# Patient Record
Sex: Female | Born: 1995 | Race: White | Hispanic: No | Marital: Single | Smoking: Current every day smoker
Health system: Southern US, Community
[De-identification: ages and names within clinical notes are randomized; demographics above are authoritative.]

## PROBLEM LIST (undated history)

## (undated) VITALS — BP 116/79 | HR 85 | Temp 97.6°F | Resp 16 | Ht 64.96 in | Wt 187.4 lb

## (undated) DIAGNOSIS — G43909 Migraine, unspecified, not intractable, without status migrainosus: Secondary | ICD-10-CM

## (undated) DIAGNOSIS — F419 Anxiety disorder, unspecified: Secondary | ICD-10-CM

## (undated) DIAGNOSIS — E669 Obesity, unspecified: Secondary | ICD-10-CM

## (undated) HISTORY — PX: NO PAST SURGERIES: SHX2092

## (undated) HISTORY — PX: TONSILLECTOMY: SUR1361

---
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
## 2015-05-28 ENCOUNTER — Encounter: Payer: Self-pay | Admitting: Emergency Medicine

## 2015-05-28 ENCOUNTER — Emergency Department
Admission: EM | Admit: 2015-05-28 | Discharge: 2015-05-28 | Disposition: A | Discharge status: Home / Self Care | Payer: Self-pay | Attending: Emergency Medicine | Admitting: Emergency Medicine

## 2015-05-28 ENCOUNTER — Emergency Department: Payer: Self-pay

## 2015-05-28 DIAGNOSIS — R102 Pelvic and perineal pain: Secondary | ICD-10-CM

## 2015-05-28 DIAGNOSIS — F17210 Nicotine dependence, cigarettes, uncomplicated: Secondary | ICD-10-CM | POA: Insufficient documentation

## 2015-05-28 DIAGNOSIS — Z3202 Encounter for pregnancy test, result negative: Secondary | ICD-10-CM | POA: Insufficient documentation

## 2015-05-28 DIAGNOSIS — Z79899 Other long term (current) drug therapy: Secondary | ICD-10-CM | POA: Insufficient documentation

## 2015-05-28 DIAGNOSIS — N83202 Unspecified ovarian cyst, left side: Secondary | ICD-10-CM | POA: Insufficient documentation

## 2015-05-28 DIAGNOSIS — B9689 Other specified bacterial agents as the cause of diseases classified elsewhere: Secondary | ICD-10-CM

## 2015-05-28 DIAGNOSIS — N760 Acute vaginitis: Secondary | ICD-10-CM | POA: Insufficient documentation

## 2015-05-28 DIAGNOSIS — Z792 Long term (current) use of antibiotics: Secondary | ICD-10-CM | POA: Insufficient documentation

## 2015-05-28 LAB — COMPREHENSIVE METABOLIC PANEL
ALBUMIN: 4.5 g/dL (ref 3.5–5.0)
ALT: 15 U/L (ref 14–54)
AST: 19 U/L (ref 15–41)
Alkaline Phosphatase: 58 U/L (ref 38–126)
Anion gap: 8 (ref 5–15)
BUN: 12 mg/dL (ref 6–20)
CHLORIDE: 107 mmol/L (ref 101–111)
CO2: 22 mmol/L (ref 22–32)
Calcium: 9.1 mg/dL (ref 8.9–10.3)
Creatinine, Ser: 0.77 mg/dL (ref 0.44–1.00)
GFR calc non Af Amer: >60 mL/min (ref >60)
GLUCOSE: 102 mg/dL — AB (ref 65–99)
Potassium: 3.5 mmol/L (ref 3.5–5.1)
SODIUM: 137 mmol/L (ref 135–145)
Total Bilirubin: 0.9 mg/dL (ref 0.3–1.2)
Total Protein: 7.6 g/dL (ref 6.5–8.1)

## 2015-05-28 LAB — URINALYSIS COMPLETE WITH MICROSCOPIC (ARMC ONLY)
Bilirubin Urine: NEGATIVE
Glucose, UA: NEGATIVE mg/dL
Nitrite: NEGATIVE
Specific Gravity, Urine: 1.031 — ABNORMAL HIGH (ref 1.005–1.030)
pH: 5 (ref 5.0–8.0)

## 2015-05-28 LAB — CBC
HCT: 38.7 % (ref 35.0–47.0)
HEMOGLOBIN: 13.4 g/dL (ref 12.0–16.0)
MCH: 31.8 pg (ref 26.0–34.0)
MCHC: 34.7 g/dL (ref 32.0–36.0)
MCV: 91.5 fL (ref 80.0–100.0)
Platelets: 283 10*3/uL (ref 150–440)
RBC: 4.23 MIL/uL (ref 3.80–5.20)
RDW: 12.5 % (ref 11.5–14.5)
WBC: 7 10*3/uL (ref 3.6–11.0)

## 2015-05-28 LAB — WET PREP, GENITAL
Clue Cells Wet Prep HPF POC: POSITIVE — AB
SPERM: NONE SEEN
Trich, Wet Prep: NONE SEEN
Yeast Wet Prep HPF POC: NONE SEEN

## 2015-05-28 LAB — CHLAMYDIA/NGC RT PCR (ARMC ONLY)
Chlamydia Tr: NOT DETECTED
N gonorrhoeae: NOT DETECTED

## 2015-05-28 LAB — LIPASE, BLOOD: LIPASE: 20 U/L (ref 11–51)

## 2015-05-28 LAB — POCT PREGNANCY, URINE: PREG TEST UR: NEGATIVE

## 2015-05-28 MED ORDER — METRONIDAZOLE 500 MG PO TABS
500.0000 mg | ORAL_TABLET | Freq: Once | ORAL | Status: AC
  Administered 2015-05-28: 500 mg via ORAL
  Filled 2015-05-28: qty 1

## 2015-05-28 MED ORDER — METRONIDAZOLE 500 MG PO TABS: 500.0000 mg | ORAL_TABLET | Freq: Two times a day (BID) | ORAL | Status: AC

## 2015-05-28 NOTE — Discharge Instructions (Signed)

## 2015-05-28 NOTE — ED Provider Notes (Signed)
[facility] Emergency Department Provider Note  ____________________________________________  Time seen: Approximately 205PM  I have reviewed the triage vital signs and the nursing notes.   HISTORY  Chief Complaint Abdominal Pain    HPI Hayley Schneider is a 20 y.o. female with a history of an IUD 4 years who is presenting today with sudden onset sharp pelvic pain that began at 73 AM. She says the pain has since dissipated. She denies any vaginal discharge but does say that she has some spotting. She denies any nausea vomiting or diarrhea. She denies any urinary symptoms. She says at this time the pain has abated.   Past Medical History  Diagnosis Date  . Anxiety   . Obesity     Patient Active Problem List   Diagnosis Date Noted  . Cannabis abuse 05/03/2012  . MDD (major depressive disorder), recurrent episode, severe (HCC) 08/12/2011  . Oppositional defiant disorder 08/12/2011  . GAD (generalized anxiety disorder) 08/12/2011    Past Surgical History  Procedure Laterality Date  . No past surgeries    . Tonsillectomy      Current Outpatient Rx  Name  Route  Sig  Dispense  Refill  . clindamycin (CLEOCIN) 300 MG capsule   Oral   Take 1 capsule (300 mg total) by mouth 3 (three) times daily.   30 capsule   0   . HYDROcodone-acetaminophen (NORCO/VICODIN) 5-325 MG per tablet   Oral   Take 1 tablet by mouth every 6 (six) hours as needed for moderate pain.   15 tablet   0   . ibuprofen (ADVIL,MOTRIN) 800 MG tablet   Oral   Take 1 tablet (800 mg total) by mouth every 6 (six) hours as needed (headache).   30 tablet   1   . levonorgestrel (MIRENA) 20 MCG/24HR IUD   Intrauterine   1 each by Intrauterine route once.           Allergies Review of patient's allergies indicates no known allergies.  Family History  Problem Relation Age of Onset  . Bipolar disorder Mother     Social History Social History  Substance Use Topics  .  Smoking status: Current Every Day Smoker -- 0.50 packs/day for 3 years    Types: Cigarettes  . Smokeless tobacco: Never Used  . Alcohol Use: Yes     Comment: occasional    Review of Systems Constitutional: No fever/chills Eyes: No visual changes. ENT: No sore throat. Cardiovascular: Denies chest pain. Respiratory: Denies shortness of breath. Gastrointestinal:   No nausea, no vomiting.  No diarrhea.  No constipation. Genitourinary: Negative for dysuria. Musculoskeletal: Negative for back pain. Skin: Negative for rash. Neurological: Negative for headaches, focal weakness or numbness.  10-point ROS otherwise negative.  ____________________________________________   PHYSICAL EXAM:  VITAL SIGNS: ED Triage Vitals  Enc Vitals Group     BP 05/28/15 1315 132/95 mmHg     Pulse Rate 05/28/15 1315 87     Resp 05/28/15 1315 20     Temp 05/28/15 1315 98.6 F (37 C)     Temp Source 05/28/15 1315 Oral     SpO2 05/28/15 1315 100 %     Weight 05/28/15 1315 160 lb (72.576 kg)     Height 05/28/15 1315  (1.651 m)     Head Cir --      Peak Flow --      Pain Score 05/28/15 1315 8     Pain Loc --  Pain Edu? --      Excl. in GC? --     Constitutional: Alert and oriented. Well appearing and in no acute distress. Eyes: Conjunctivae are normal. PERRL. EOMI. Head: Atraumatic. Nose: No congestion/rhinnorhea. Mouth/Throat: Mucous membranes are moist.   Neck: No stridor.   Cardiovascular: Normal rate, regular rhythm. Grossly normal heart sounds.  Respiratory: Normal respiratory effort.  No retractions. Lungs CTAB. Gastrointestinal: Sowith mild suprapubic tenderness to palpation.  No distention. No abdominal bruits. No CVA tenderness. Genitourinary:  Normal external appearance. Speculum exam with a yellow to green following discharge. The strings from the IUD are found protruding from the cervix. Bimanual exam without any cervical motion tenderness. There is no uterine or adnexal  tenderness nor  are there any masses palpated.  Musculoskeletal: No lower extremity tenderness nor edema.  No joint effusions. Neurologic:  Normal speech and language. No gross focal neurologic deficits are appreciated. No gait instability. Skin:  Skin is warm, dry and intact. No rash noted. Psychiatric: Mood and affect are normal. Speech and behavior are normal.  ____________________________________________   LABS (all labs ordered are listed, but only abnormal results are displayed)  Labs Reviewed  WET PREP, GENITAL - Abnormal; Notable for the following:    Clue Cells Wet Prep HPF POC POSITIVE (*)    WBC, Wet Prep HPF POC FEW (*)    All other components within normal limits  COMPREHENSIVE METABOLIC PANEL - Abnormal; Notable for the following:    Glucose, Bld 102 (*)    All other components within normal limits  URINALYSIS COMPLETEWITH MICROSCOPIC ([facility] ONLY) - Abnormal; Notable for the following:    Color, Urine AMBER (*)    APPearance CLOUDY (*)    Ketones, ur 1+ (*)    Specific Gravity, Urine 1.031 (*)    Hgb urine dipstick 3+ (*)    Protein, ur >500 (*)    Leukocytes, UA 2+ (*)    Bacteria, UA MANY (*)    Squamous Epithelial / LPF TOO NUMEROUS TO COUNT (*)    All other components within normal limits  CHLAMYDIA/NGC RT PCR ([facility] ONLY)  URINE CULTURE  LIPASE, BLOOD  CBC  POCT PREGNANCY, URINE  POC URINE PREG, ED   ____________________________________________  EKG   ____________________________________________  RADIOLOGY  IMPRESSION: 1. No specific abnormality is identified. There is a 2 cm suspected corpus luteum cyst in the left ovary. No free pelvic fluid. The IUD appears satisfactorily positioned. ____________________________________________   PROCEDURES    ____________________________________________   INITIAL IMPRESSION / ASSESSMENT AND PLAN / ED COURSE  Pertinent labs & imaging results that were available during my care of the patient  were reviewed by me and considered in my medical decision making (see chart for details).  Concerned because of discharge that her pain may be secondary to an intravaginal infection. Doubt pathology related to the IUD as the strings were found to be in place.   ----------------------------------------- 4:08 PM on 05/28/2015 -----------------------------------------  Patient is resting comfortably in the room without any signs of distress. Updated patient about her lab as well as imaging results. 2 cm suspected course luteum cyst in the left. However, more likely cause of the symptoms is bacterial vaginosis. I will give her follow-up with the on call your hand. Do not suspect the IUD is causing any issue this visit. We'll discharge with Flagyl. Patient knows not to drink with taking antibiotic. She  Is Understanding of the plan and willing to comply. ____________________________________________   FINAL CLINICAL  IMPRESSION(S) / ED DIAGNOSES  Final diagnoses:  Pelvic pain in female  Pelvic pain in female   bacterial vaginosis  Myrna Blazer, MD 05/28/15 [phone]

## 2015-05-28 NOTE — ED Notes (Signed)
AAOx3.  Skin warm and dry.  NAD  Ambulates with easy and steady gait.  Posture upright and relaxed. 

## 2015-05-28 NOTE — ED Notes (Signed)
Pt to ed with c/o abd pain that started this am.  Pt states sharpe lower abd pain.  Reports she has a mirena in place x 4 years and is concerned it may be related to that. Denies vaginal bleeding.

## 2015-05-30 LAB — URINE CULTURE

## 2015-06-29 ENCOUNTER — Encounter: Payer: Self-pay | Admitting: Emergency Medicine

## 2015-06-29 ENCOUNTER — Emergency Department
Admission: EM | Admit: 2015-06-29 | Discharge: 2015-06-29 | Disposition: A | Discharge status: Home / Self Care | Payer: Medicaid Other | Attending: Emergency Medicine | Admitting: Emergency Medicine

## 2015-06-29 DIAGNOSIS — Z792 Long term (current) use of antibiotics: Secondary | ICD-10-CM | POA: Insufficient documentation

## 2015-06-29 DIAGNOSIS — R102 Pelvic and perineal pain: Secondary | ICD-10-CM

## 2015-06-29 DIAGNOSIS — Z3202 Encounter for pregnancy test, result negative: Secondary | ICD-10-CM | POA: Insufficient documentation

## 2015-06-29 DIAGNOSIS — F17210 Nicotine dependence, cigarettes, uncomplicated: Secondary | ICD-10-CM | POA: Insufficient documentation

## 2015-06-29 LAB — URINALYSIS COMPLETE WITH MICROSCOPIC (ARMC ONLY)
Bilirubin Urine: NEGATIVE
Glucose, UA: NEGATIVE mg/dL
KETONES UR: NEGATIVE mg/dL
Leukocytes, UA: NEGATIVE
NITRITE: NEGATIVE
PH: 6 (ref 5.0–8.0)
PROTEIN: NEGATIVE mg/dL
SPECIFIC GRAVITY, URINE: 1.023 (ref 1.005–1.030)

## 2015-06-29 LAB — CBC WITH DIFFERENTIAL/PLATELET
BASOS ABS: 0.1 10*3/uL (ref 0–0.1)
BASOS PCT: 1 %
EOS PCT: 5 %
Eosinophils Absolute: 0.4 10*3/uL (ref 0–0.7)
HCT: 40.2 % (ref 35.0–47.0)
Hemoglobin: 14.1 g/dL (ref 12.0–16.0)
Lymphocytes Relative: 40 %
Lymphs Abs: 3.1 10*3/uL (ref 1.0–3.6)
MCH: 32.5 pg (ref 26.0–34.0)
MCHC: 35.1 g/dL (ref 32.0–36.0)
MCV: 92.6 fL (ref 80.0–100.0)
Monocytes Absolute: 0.6 10*3/uL (ref 0.2–0.9)
Monocytes Relative: 8 %
Neutro Abs: 3.5 10*3/uL (ref 1.4–6.5)
Neutrophils Relative %: 46 %
PLATELETS: 273 10*3/uL (ref 150–440)
RBC: 4.34 MIL/uL (ref 3.80–5.20)
RDW: 12.9 % (ref 11.5–14.5)
WBC: 7.8 10*3/uL (ref 3.6–11.0)

## 2015-06-29 LAB — LIPASE, BLOOD: Lipase: 30 U/L (ref 11–51)

## 2015-06-29 LAB — COMPREHENSIVE METABOLIC PANEL
ALT: 20 U/L (ref 14–54)
AST: 20 U/L (ref 15–41)
Albumin: 4.4 g/dL (ref 3.5–5.0)
Alkaline Phosphatase: 56 U/L (ref 38–126)
Anion gap: 8 (ref 5–15)
BUN: 13 mg/dL (ref 6–20)
CHLORIDE: 107 mmol/L (ref 101–111)
CO2: 24 mmol/L (ref 22–32)
CREATININE: 0.85 mg/dL (ref 0.44–1.00)
Calcium: 9 mg/dL (ref 8.9–10.3)
GFR calc non Af Amer: >60 mL/min (ref >60)
Glucose, Bld: 98 mg/dL (ref 65–99)
Potassium: 3.4 mmol/L — ABNORMAL LOW (ref 3.5–5.1)
Sodium: 139 mmol/L (ref 135–145)
Total Bilirubin: 0.5 mg/dL (ref 0.3–1.2)
Total Protein: 7.5 g/dL (ref 6.5–8.1)

## 2015-06-29 LAB — POCT PREGNANCY, URINE: Preg Test, Ur: NEGATIVE

## 2015-06-29 MED ORDER — ONDANSETRON HCL 4 MG/2ML IJ SOLN
4.0000 mg | Freq: Once | INTRAMUSCULAR | Status: AC
  Administered 2015-06-29: 4 mg via INTRAVENOUS

## 2015-06-29 MED ORDER — MORPHINE SULFATE (PF) 4 MG/ML IV SOLN
4.0000 mg | Freq: Once | INTRAVENOUS | Status: AC
  Administered 2015-06-29: 4 mg via INTRAVENOUS

## 2015-06-29 MED ORDER — MORPHINE SULFATE (PF) 4 MG/ML IV SOLN
INTRAVENOUS | Status: AC
  Administered 2015-06-29: 4 mg via INTRAVENOUS
  Filled 2015-06-29: qty 1

## 2015-06-29 MED ORDER — TRAMADOL HCL 50 MG PO TABS: 50.0000 mg | ORAL_TABLET | Freq: Four times a day (QID) | ORAL | Status: AC | PRN

## 2015-06-29 MED ORDER — ONDANSETRON HCL 4 MG/2ML IJ SOLN
INTRAMUSCULAR | Status: AC
  Administered 2015-06-29: 4 mg via INTRAVENOUS
  Filled 2015-06-29: qty 2

## 2015-06-29 NOTE — ED Provider Notes (Signed)
[facility] Emergency Department Provider Note    ____________________________________________  Time seen: ~0510  I have reviewed the triage vital signs and the nursing notes.   HISTORY  Chief Complaint Abdominal pain  History limited by: Not Limited   HPI Hayley Schneider is a 20 y.o. female who presents to the emergency department today because of concerns for abdominal pain. She states that the pain started today. She did take some pain medication. Became severe tonight. She tried to sleep but was unable to. She describes the pain as being located in the suprapubic region. It is typically cramping but then will become sharp. She did have a bowel movement which did not change the pain. She states she was seen in the emergency department a couple of weeks ago and diagnosed with ovarian cyst however states today's pain is worse. No recent fevers. No nausea or vomiting.   Past Medical History  Diagnosis Date  . Anxiety   . Obesity     Patient Active Problem List   Diagnosis Date Noted  . Cannabis abuse 05/03/2012  . MDD (major depressive disorder), recurrent episode, severe (HCC) 08/12/2011  . Oppositional defiant disorder 08/12/2011  . GAD (generalized anxiety disorder) 08/12/2011    Past Surgical History  Procedure Laterality Date  . No past surgeries    . Tonsillectomy      Current Outpatient Rx  Name  Route  Sig  Dispense  Refill  . clindamycin (CLEOCIN) 300 MG capsule   Oral   Take 1 capsule (300 mg total) by mouth 3 (three) times daily.   30 capsule   0   . HYDROcodone-acetaminophen (NORCO/VICODIN) 5-325 MG per tablet   Oral   Take 1 tablet by mouth every 6 (six) hours as needed for moderate pain.   15 tablet   0   . ibuprofen (ADVIL,MOTRIN) 800 MG tablet   Oral   Take 1 tablet (800 mg total) by mouth every 6 (six) hours as needed (headache).   30 tablet   1   . levonorgestrel (MIRENA) 20 MCG/24HR IUD   Intrauterine   1  each by Intrauterine route once.           Allergies Review of patient's allergies indicates no known allergies.  Family History  Problem Relation Age of Onset  . Bipolar disorder Mother     Social History Social History  Substance Use Topics  . Smoking status: Current Every Day Smoker -- 0.50 packs/day for 3 years    Types: Cigarettes  . Smokeless tobacco: Never Used  . Alcohol Use: Yes     Comment: occasional    Review of Systems  Constitutional: Negative for fever. Cardiovascular: Negative for chest pain. Respiratory: Negative for shortness of breath. Gastrointestinal: Negative for abdominal pain, vomiting and diarrhea. Neurological: Negative for headaches, focal weakness or numbness.  10-point ROS otherwise negative.  ____________________________________________   PHYSICAL EXAM:  VITAL SIGNS:    97.8 F (36.6 C)  98   24  138/80 mmHg  99 %    Constitutional: Alert and oriented. Appears uncomfortable. Eyes: Conjunctivae are normal. PERRL. Normal extraocular movements. ENT   Head: Normocephalic and atraumatic.   Nose: No congestion/rhinnorhea.   Mouth/Throat: Mucous membranes are moist.   Neck: No stridor. Hematological/Lymphatic/Immunilogical: No cervical lymphadenopathy. Cardiovascular: Normal rate, regular rhythm.  No murmurs, rubs, or gallops. Respiratory: Normal respiratory effort without tachypnea nor retractions. Breath sounds are clear and equal bilaterally. No wheezes/rales/rhonchi. Gastrointestinal: Soft and nontender. No distention.  There is no CVA tenderness. Genitourinary: Deferred Musculoskeletal: Normal range of motion in all extremities. No joint effusions.  No lower extremity tenderness nor edema. Neurologic:  Normal speech and language. No gross focal neurologic deficits are appreciated.  Skin:  Skin is warm, dry and intact. No rash noted. Psychiatric: Mood and affect are normal. Speech and behavior are normal. Patient  exhibits appropriate insight and judgment.  ____________________________________________    LABS (pertinent positives/negatives)  Labs Reviewed  COMPREHENSIVE METABOLIC PANEL - Abnormal; Notable for the following:    Potassium 3.4 (*)    All other components within normal limits  URINALYSIS COMPLETEWITH MICROSCOPIC ([facility] ONLY) - Abnormal; Notable for the following:    Color, Urine YELLOW (*)    APPearance HAZY (*)    Hgb urine dipstick 1+ (*)    Bacteria, UA RARE (*)    Squamous Epithelial / LPF 6-30 (*)    All other components within normal limits  LIPASE, BLOOD  CBC WITH DIFFERENTIAL/PLATELET  POCT PREGNANCY, URINE     ____________________________________________   EKG  None  ____________________________________________    RADIOLOGY  None  ____________________________________________   PROCEDURES  Procedure(s) performed: None  Critical Care performed: No  ____________________________________________   INITIAL IMPRESSION / ASSESSMENT AND PLAN / ED COURSE  Pertinent labs & imaging results that were available during my care of the patient were reviewed by me and considered in my medical decision making (see chart for details).  Patient presented to the emergency department today because of concerns for pelvic pain. On exam patient initially looked to be in some distress. She was given IV pain medication with good relief. Blood work without any leukocytosis. Urine without any concerning findings. At this point not quite clear etiology however I doubt serious intra-abdominal infection. Did discuss with patient that we could repeat the ultrasound. She did have an ultrasound done last month which showed ovarian cyst. At this point patient states she will defer repeat ultrasound. I think this is reasonable. Although ovarian torsion is possible I think this is less likely given that the pain was perfectly midline and she did have good relief after medication. Did  discuss with patient importance of following up with OB/GYN doctor.  ____________________________________________   FINAL CLINICAL IMPRESSION(S) / ED DIAGNOSES  Final diagnoses:  Pelvic pain in female     Phineas SemenGraydon Makeshia Seat, MD 06/29/15 [phone]

## 2015-06-29 NOTE — ED Notes (Signed)
Pt reports abd pain starting yesterday, awoke her from sleep, pain located across mid abdomen.  Pt denies vomiting and diarrhea, states nausea, LBM this morning.  Pt tearful and in moderate distress at this time.  Pt dx recently w/ ovarian cyst

## 2015-06-29 NOTE — Discharge Instructions (Signed)
Please seek medical attention for any high fevers, chest pain, shortness of breath, change in behavior, persistent vomiting, bloody stool or any other new or concerning symptoms. ° ° °Pelvic Pain, Female °Female pelvic pain can be caused by many different things and start from a variety of places. Pelvic pain refers to pain that is located in the lower half of the abdomen and between your hips. The pain may occur over a short period of time (acute) or may be reoccurring (chronic). The cause of pelvic pain may be related to disorders affecting the female reproductive organs (gynecologic), but it may also be related to the bladder, kidney stones, an intestinal complication, or muscle or skeletal problems. Getting help right away for pelvic pain is important, especially if there has been severe, sharp, or a sudden onset of unusual pain. It is also important to get help right away because some types of pelvic pain can be life threatening.  °CAUSES  °Below are only some of the causes of pelvic pain. The causes of pelvic pain can be in one of several categories.  °· Gynecologic. °¨ Pelvic inflammatory disease. °¨ Sexually transmitted infection. °¨ Ovarian cyst or a twisted ovarian ligament (ovarian torsion). °¨ Uterine lining that grows outside the uterus (endometriosis). °¨ Fibroids, cysts, or tumors. °¨ Ovulation. °· Pregnancy. °¨ Pregnancy that occurs outside the uterus (ectopic pregnancy). °¨ Miscarriage. °¨ Labor. °¨ Abruption of the placenta or ruptured uterus. °· Infection. °¨ Uterine infection (endometritis). °¨ Bladder infection. °¨ Diverticulitis. °¨ Miscarriage related to a uterine infection (septic abortion). °· Bladder. °¨ Inflammation of the bladder (cystitis). °¨ Kidney stone(s). °· Gastrointestinal. °¨ Constipation. °¨ Diverticulitis. °· Neurologic. °¨ Trauma. °¨ Feeling pelvic pain because of mental or emotional causes (psychosomatic). °· Cancers of the bowel or pelvis. °EVALUATION  °Your caregiver will  want to take a careful history of your concerns. This includes recent changes in your health, a careful gynecologic history of your periods (menses), and a sexual history. Obtaining your family history and medical history is also important. Your caregiver may suggest a pelvic exam. A pelvic exam will help identify the location and severity of the pain. It also helps in the evaluation of which organ system may be involved. In order to identify the cause of the pelvic pain and be properly treated, your caregiver may order tests. These tests may include:  °· A pregnancy test. °· Pelvic ultrasonography. °· An X-ray exam of the abdomen. °· A urinalysis or evaluation of vaginal discharge. °· Blood tests. °HOME CARE INSTRUCTIONS  °· Only take over-the-counter or prescription medicines for pain, discomfort, or fever as directed by your caregiver.   °· Rest as directed by your caregiver.   °· Eat a balanced diet.   °· Drink enough fluids to make your urine clear or pale yellow, or as directed.   °· Avoid sexual intercourse if it causes pain.   °· Apply warm or cold compresses to the lower abdomen depending on which one helps the pain.   °· Avoid stressful situations.   °· Keep a journal of your pelvic pain. Write down when it started, where the pain is located, and if there are things that seem to be associated with the pain, such as food or your menstrual cycle. °· Follow up with your caregiver as directed.   °SEEK MEDICAL CARE IF: °· Your medicine does not help your pain. °· You have abnormal vaginal discharge. °SEEK IMMEDIATE MEDICAL CARE IF:  °· You have heavy bleeding from the vagina.   °· Your pelvic pain increases.   °·   You feel light-headed or faint.   °· You have chills.   °· You have pain with urination or blood in your urine.   °· You have uncontrolled diarrhea or vomiting.   °· You have a fever or persistent symptoms for more than 3 days. °· You have a fever and your symptoms suddenly get worse.   °· You are  being physically or sexually abused. °  °This information is not intended to replace advice given to you by your health care provider. Make sure you discuss any questions you have with your health care provider. °  °Document Released: 03/02/2004 Document Revised: 12/25/2014 Document Reviewed: 07/26/2011 °Elsevier Interactive Patient Education ©2016 [employer] ° °

## 2015-08-14 ENCOUNTER — Ambulatory Visit: Payer: Self-pay

## 2015-08-14 ENCOUNTER — Encounter: Payer: Self-pay | Admitting: Emergency Medicine

## 2015-08-14 ENCOUNTER — Emergency Department
Admission: EM | Admit: 2015-08-14 | Discharge: 2015-08-14 | Disposition: A | Discharge status: Home / Self Care | Payer: Self-pay | Attending: Emergency Medicine | Admitting: Emergency Medicine

## 2015-08-14 DIAGNOSIS — F17210 Nicotine dependence, cigarettes, uncomplicated: Secondary | ICD-10-CM | POA: Insufficient documentation

## 2015-08-14 DIAGNOSIS — J029 Acute pharyngitis, unspecified: Secondary | ICD-10-CM | POA: Insufficient documentation

## 2015-08-14 DIAGNOSIS — E669 Obesity, unspecified: Secondary | ICD-10-CM | POA: Insufficient documentation

## 2015-08-14 DIAGNOSIS — R51 Headache: Secondary | ICD-10-CM | POA: Insufficient documentation

## 2015-08-14 LAB — CBC WITH DIFFERENTIAL/PLATELET
BASOS PCT: 1 %
Basophils Absolute: 0.1 10*3/uL (ref 0–0.1)
EOS ABS: 0.3 10*3/uL (ref 0–0.7)
EOS PCT: 2 %
HEMATOCRIT: 39.9 % (ref 35.0–47.0)
HEMOGLOBIN: 13.7 g/dL (ref 12.0–16.0)
LYMPHS PCT: 12 %
Lymphs Abs: 1.7 10*3/uL (ref 1.0–3.6)
MCH: 31.8 pg (ref 26.0–34.0)
MCHC: 34.3 g/dL (ref 32.0–36.0)
MCV: 92.7 fL (ref 80.0–100.0)
Monocytes Absolute: 0.7 10*3/uL (ref 0.2–0.9)
Monocytes Relative: 5 %
NEUTROS PCT: 80 %
Neutro Abs: 11.3 10*3/uL — ABNORMAL HIGH (ref 1.4–6.5)
Platelets: 245 10*3/uL (ref 150–440)
RBC: 4.3 MIL/uL (ref 3.80–5.20)
RDW: 12.9 % (ref 11.5–14.5)
WBC: 14.1 10*3/uL — ABNORMAL HIGH (ref 3.6–11.0)

## 2015-08-14 LAB — COMPREHENSIVE METABOLIC PANEL
ALT: 15 U/L (ref 14–54)
ANION GAP: 10 (ref 5–15)
AST: 18 U/L (ref 15–41)
Albumin: 4.3 g/dL (ref 3.5–5.0)
Alkaline Phosphatase: 54 U/L (ref 38–126)
BUN: 6 mg/dL (ref 6–20)
CHLORIDE: 108 mmol/L (ref 101–111)
CO2: 23 mmol/L (ref 22–32)
Calcium: 9 mg/dL (ref 8.9–10.3)
Creatinine, Ser: 0.82 mg/dL (ref 0.44–1.00)
GFR calc non Af Amer: >60 mL/min (ref >60)
Glucose, Bld: 93 mg/dL (ref 65–99)
POTASSIUM: 3.2 mmol/L — AB (ref 3.5–5.1)
SODIUM: 141 mmol/L (ref 135–145)
Total Bilirubin: 0.5 mg/dL (ref 0.3–1.2)
Total Protein: 7.3 g/dL (ref 6.5–8.1)

## 2015-08-14 LAB — MONONUCLEOSIS SCREEN: MONO SCREEN: NEGATIVE

## 2015-08-14 LAB — POCT RAPID STREP A: Streptococcus, Group A Screen (Direct): NEGATIVE

## 2015-08-14 MED ORDER — PENTAFLUOROPROP-TETRAFLUOROETH EX AERO: INHALATION_SPRAY | CUTANEOUS | Status: DC | PRN

## 2015-08-14 MED ORDER — MAGIC MOUTHWASH W/LIDOCAINE: 5.0000 mL | Freq: Four times a day (QID) | ORAL | Status: DC

## 2015-08-14 MED ORDER — AMOXICILLIN 875 MG PO TABS: 875.0000 mg | ORAL_TABLET | Freq: Two times a day (BID) | ORAL | Status: DC

## 2015-08-14 NOTE — Discharge Instructions (Signed)
Pharyngitis Pharyngitis is redness, pain, and swelling (inflammation) of your pharynx.  CAUSES  Pharyngitis is usually caused by infection. Most of the time, these infections are from viruses (viral) and are part of a cold. However, sometimes pharyngitis is caused by bacteria (bacterial). Pharyngitis can also be caused by allergies. Viral pharyngitis may be spread from person to person by coughing, sneezing, and personal items or utensils (cups, forks, spoons, toothbrushes). Bacterial pharyngitis may be spread from person to person by more intimate contact, such as kissing.  SIGNS AND SYMPTOMS  Symptoms of pharyngitis include:   Sore throat.   Tiredness (fatigue).   Low-grade fever.   Headache.  Joint pain and muscle aches.  Skin rashes.  Swollen lymph nodes.  Plaque-like film on throat or tonsils (often seen with bacterial pharyngitis). DIAGNOSIS  Your health care provider will ask you questions about your illness and your symptoms. Your medical history, along with a physical exam, is often all that is needed to diagnose pharyngitis. Sometimes, a rapid strep test is done. Other lab tests may also be done, depending on the suspected cause.  TREATMENT  Viral pharyngitis will usually get better in 3-4 days without the use of medicine. Bacterial pharyngitis is treated with medicines that kill germs (antibiotics).  HOME CARE INSTRUCTIONS   Drink enough water and fluids to keep your urine clear or pale yellow.   Only take over-the-counter or prescription medicines as directed by your health care provider:   If you are prescribed antibiotics, make sure you finish them even if you start to feel better.   Do not take aspirin.   Get lots of rest.   Gargle with 8 oz of salt water ( tsp of salt per 1 qt of water) as often as every 1-2 hours to soothe your throat.   Throat lozenges (if you are not at risk for choking) or sprays may be used to soothe your throat. SEEK MEDICAL  CARE IF:   You have large, tender lumps in your neck.  You have a rash.  You cough up green, yellow-brown, or bloody spit. SEEK IMMEDIATE MEDICAL CARE IF:   Your neck becomes stiff.  You drool or are unable to swallow liquids.  You vomit or are unable to keep medicines or liquids down.  You have severe pain that does not go away with the use of recommended medicines.  You have trouble breathing (not caused by a stuffy nose). MAKE SURE YOU:   Understand these instructions.  Will watch your condition.  Will get help right away if you are not doing well or get worse.   This information is not intended to replace advice given to you by your health care provider. Make sure you discuss any questions you have with your health care provider.   Document Released: 04/05/2005 Document Revised: 01/24/2013 Document Reviewed: 12/11/2012 Elsevier Interactive Patient Education 2016 [employer] Inc[employer]  Sore Throat A sore throat is pain, burning, irritation, or scratchiness of the throat. There is often pain or tenderness when swallowing or talking. A sore throat may be accompanied by other symptoms, such as coughing, sneezing, fever, and swollen neck glands. A sore throat is often the first sign of another sickness, such as a cold, flu, strep throat, or mononucleosis (commonly known as mono). Most sore throats go away without medical treatment. CAUSES  The most common causes of a sore throat include:  A viral infection, such as a cold, flu, or mono.  A bacterial infection, such as strep throat,  tonsillitis, or whooping cough. °· Seasonal allergies. °· Dryness in the air. °· Irritants, such as smoke or pollution. °· Gastroesophageal reflux disease (GERD). °HOME CARE INSTRUCTIONS  °· Only take over-the-counter medicines as directed by your caregiver. °· Drink enough fluids to keep your urine clear or pale yellow. °· Rest as needed. °· Try using throat sprays, lozenges, or sucking on hard candy to ease  any pain (if older than 4 years or as directed). °· Sip warm liquids, such as broth, herbal tea, or warm water with honey to relieve pain temporarily. You may also eat or drink cold or frozen liquids such as frozen ice pops. °· Gargle with salt water (mix 1 tsp salt with 8 oz of water). °· Do not smoke and avoid secondhand smoke. °· Put a cool-mist humidifier in your bedroom at night to moisten the air. You can also turn on a hot shower and sit in the bathroom with the door closed for 5-10 minutes. °SEEK IMMEDIATE MEDICAL CARE IF: °· You have difficulty breathing. °· You are unable to swallow fluids, soft foods, or your saliva. °· You have increased swelling in the throat. °· Your sore throat does not get better in 7 days. °· You have nausea and vomiting. °· You have a fever or persistent symptoms for more than 2-3 days. °· You have a fever and your symptoms suddenly get worse. °MAKE SURE YOU:  °· Understand these instructions. °· Will watch your condition. °· Will get help right away if you are not doing well or get worse. °  °This information is not intended to replace advice given to you by your health care provider. Make sure you discuss any questions you have with your health care provider. °  °Document Released: 05/13/2004 Document Revised: 04/26/2014 Document Reviewed: 12/12/2011 °Elsevier Interactive Patient Education ©2016 [employer] Inc[employer] ° °

## 2015-08-14 NOTE — ED Notes (Signed)
Sore throat for couple of days  Having body aches  Increased pain with swallowing.  Unsure of fever

## 2015-08-14 NOTE — ED Provider Notes (Signed)
[facility] Emergency Department Provider Note  ____________________________________________  Time seen: Approximately 7:18 PM  I have reviewed the triage vital signs and the nursing notes.   HISTORY  Chief Complaint Sore Throat    HPI Hayley Schneider is a 20 y.o. female who presents to emergency department complaining of fever, chills, body aches, sore throat, headache, neck pain 3 days. Patient reports that she did have a history of strep throat repeatedly until she had a tonsillectomy 2 years prior. Patient states that she has not had strep since this occurrence. Patient states that the pain is different than review strep but is unable to describe difference. Patient endorses a generalized headache and neck pain. She denies any visual acuity changes, difficulty breathing or swallowing, chest pain, shortness of breath, abdominal pain, nausea or vomiting. Patient reports pain with swallowing but states that she is able to swallow appropriately.   Past Medical History  Diagnosis Date  . Anxiety   . Obesity     Patient Active Problem List   Diagnosis Date Noted  . Cannabis abuse 05/03/2012  . MDD (major depressive disorder), recurrent episode, severe (HCC) 08/12/2011  . Oppositional defiant disorder 08/12/2011  . GAD (generalized anxiety disorder) 08/12/2011    Past Surgical History  Procedure Laterality Date  . No past surgeries    . Tonsillectomy      Current Outpatient Rx  Name  Route  Sig  Dispense  Refill  . amoxicillin (AMOXIL) 875 MG tablet   Oral   Take 1 tablet (875 mg total) by mouth 2 (two) times daily.   14 tablet   0   . clindamycin (CLEOCIN) 300 MG capsule   Oral   Take 1 capsule (300 mg total) by mouth 3 (three) times daily.   30 capsule   0   . HYDROcodone-acetaminophen (NORCO/VICODIN) 5-325 MG per tablet   Oral   Take 1 tablet by mouth every 6 (six) hours as needed for moderate pain.   15 tablet   0   . ibuprofen  (ADVIL,MOTRIN) 800 MG tablet   Oral   Take 1 tablet (800 mg total) by mouth every 6 (six) hours as needed (headache).   30 tablet   1   . levonorgestrel (MIRENA) 20 MCG/24HR IUD   Intrauterine   1 each by Intrauterine route once.         . magic mouthwash w/lidocaine SOLN   Oral   Take 5 mLs by mouth 4 (four) times daily.   240 mL   0     Dispense in a 1/1/1/1 ratio. Use lidocaine, diphen ...   . traMADol (ULTRAM) 50 MG tablet   Oral   Take 1 tablet (50 mg total) by mouth every 6 (six) hours as needed.   15 tablet   0     Allergies Review of patient's allergies indicates no known allergies.  Family History  Problem Relation Age of Onset  . Bipolar disorder Mother     Social History Social History  Substance Use Topics  . Smoking status: Current Every Day Smoker -- 0.50 packs/day for 3 years    Types: Cigarettes  . Smokeless tobacco: Never Used  . Alcohol Use: Yes     Comment: occasional     Review of Systems  Constitutional: Positive fever/chills Eyes: No visual changes. No discharge ENT: Positive for sore throat. Positive for nasal congestion. Denies ear pain. Cardiovascular: no chest pain. Respiratory: no cough. No SOB. Gastrointestinal: No abdominal  pain.  No nausea, no vomiting.   Musculoskeletal: Negative for back pain. Skin: Negative for rash. Neurological: Positive for headache but denies focal weakness or numbness. 10-point ROS otherwise negative.  ____________________________________________   PHYSICAL EXAM:  VITAL SIGNS: ED Triage Vitals  Enc Vitals Group     BP 08/14/15 1835 149/85 mmHg     Pulse Rate 08/14/15 1835 116     Resp 08/14/15 1835 18     Temp 08/14/15 1835 99.3 F (37.4 C)     Temp Source 08/14/15 1835 Oral     SpO2 08/14/15 1835 99 %     Weight 08/14/15 1835 160 lb (72.576 kg)     Height 08/14/15 1835  (1.651 m)     Head Cir --      Peak Flow --      Pain Score 08/14/15 1910 8     Pain Loc --      Pain Edu? --       Excl. in GC? --      Constitutional: Alert and oriented. Mildly ill appearing but in no acute distress. Eyes: Conjunctivae are normal. PERRL. EOMI. Head: Atraumatic. ENT:      Ears: EACs and TMs are unremarkable bilaterally.      Nose: Moderate congestion/rhinnorhea.      Mouth/Throat: Mucous membranes are moist. Oropharynx is erythematous but nonedematous. Uvula is midline. Tonsils are absent consistent with tonsillectomy. Neck: No stridor.  No cervical spine tenderness to palpation. Patient does have good range of motion to her neck with some reduction of movement and extreme lateral movements. Hematological/Lymphatic/Immunilogical: Tender anterior cervical lymphadenopathy. Cardiovascular: Normal rate, regular rhythm. Normal S1 and S2.  Good peripheral circulation. Respiratory: Normal respiratory effort without tachypnea or retractions. Lungs CTAB. Gastrointestinal: Soft and nontender. No distention.  Musculoskeletal: Full range of motion all extremity's. Neurologic:  Normal speech and language. No gross focal neurologic deficits are appreciated. Radial nerves II through XII grossly intact. Skin:  Skin is warm, dry and intact. No rash noted. Psychiatric: Mood and affect are normal. Speech and behavior are normal. Patient exhibits appropriate insight and judgement.   ____________________________________________   LABS (all labs ordered are listed, but only abnormal results are displayed)  Labs Reviewed  COMPREHENSIVE METABOLIC PANEL - Abnormal; Notable for the following:    Potassium 3.2 (*)    All other components within normal limits  CBC WITH DIFFERENTIAL/PLATELET - Abnormal; Notable for the following:    WBC 14.1 (*)    Neutro Abs 11.3 (*)    All other components within normal limits  CULTURE, GROUP A STREP [facility])  MONONUCLEOSIS SCREEN  POCT RAPID STREP A    ____________________________________________  EKG   ____________________________________________  RADIOLOGY   No results found.  ____________________________________________    PROCEDURES  Procedure(s) performed:       Medications - No data to display   ____________________________________________   INITIAL IMPRESSION / ASSESSMENT AND PLAN / ED COURSE  Pertinent labs & imaging results that were available during my care of the patient were reviewed by me and considered in my medical decision making (see chart for details).  Patient's diagnosis is consistent with pharyngitis. Patient's labs returned with slightly elevated white blood cell count but other labs are reassuring. Patient's exam is positive for pharyngeal erythema but otherwise is reassuring. Patient did have a negative strep test in the emergency department as well as a negative mono. However, patient will be treated with antibiotics prophylactically due to patient's presentation.. Patient will be  discharged home with prescriptions for Magic mouthwash and amoxicillin. Patient is to follow up with primary care provider if symptoms persist past this treatment course. Patient is given ED precautions to return to the ED for any worsening or new symptoms.     ____________________________________________  FINAL CLINICAL IMPRESSION(S) / ED DIAGNOSES  Final diagnoses:  Acute pharyngitis, unspecified etiology      NEW MEDICATIONS STARTED DURING THIS VISIT:  New Prescriptions   AMOXICILLIN (AMOXIL) 875 MG TABLET    Take 1 tablet (875 mg total) by mouth 2 (two) times daily.   MAGIC MOUTHWASH W/LIDOCAINE SOLN    Take 5 mLs by mouth 4 (four) times daily.        This chart was dictated using voice recognition software/Dragon. Despite best efforts to proofread, errors can occur which can change the meaning. Any change was purely unintentional.    Racheal PatchesJonathan D Rhilee Currin, PA-C 08/14/15 [id number]  Jennye MoccasinBrian S  Quigley, MD 08/18/15 1500

## 2015-08-14 NOTE — ED Notes (Signed)

## 2015-08-17 LAB — CULTURE, GROUP A STREP (THRC)

## 2016-11-20 IMAGING — US US ART/VEN ABD/PELV/SCROTUM DOPPLER LTD
1 series · 13 of 25 positions shown · non-contrast
Comparison: None.

CLINICAL DATA: Pelvic pain for the past day. Mirena IUD 4 years
ago.

EXAM:
TRANSABDOMINAL AND TRANSVAGINAL ULTRASOUND OF PELVIS
DOPPLER ULTRASOUND OF OVARIES
TECHNIQUE: Both transabdominal and transvaginal ultrasound examinations of the
pelvis were performed. Transabdominal technique was performed for
global imaging of the pelvis including uterus, ovaries, adnexal
regions, and pelvic cul-de-sac.
It was necessary to proceed with endovaginal exam following the
transabdominal exam to visualize the endometrium and ovaries. Color
and duplex Doppler ultrasound was utilized to evaluate blood flow to
the ovaries.

[Series 1: us art/ven abd/pelv/scrotum doppler ltd · 0.18mm/px · 13 of 150 slices shown]
[im 1/150]
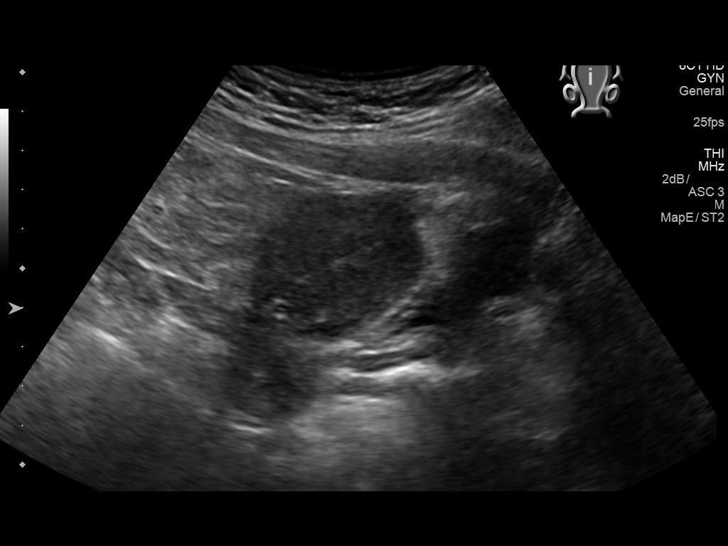
[im 13/150]
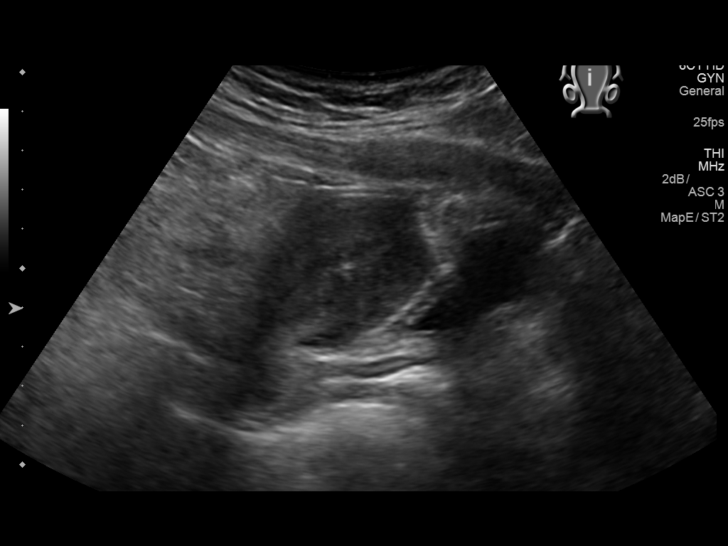
[im 25/150]
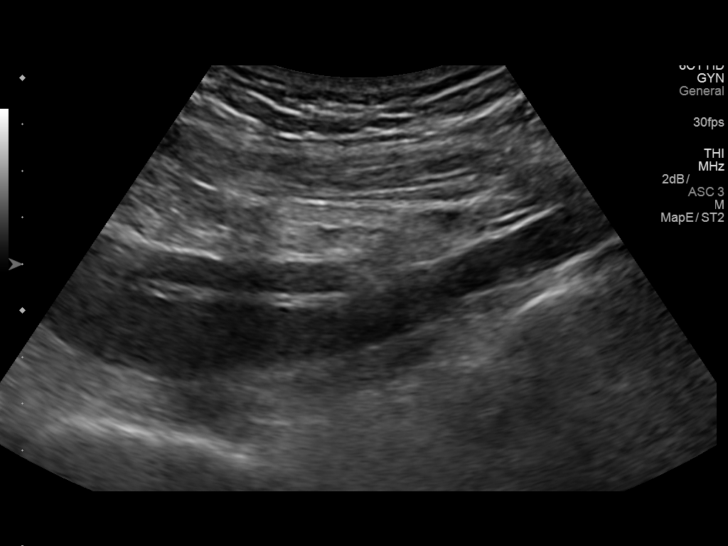
[im 38/150]
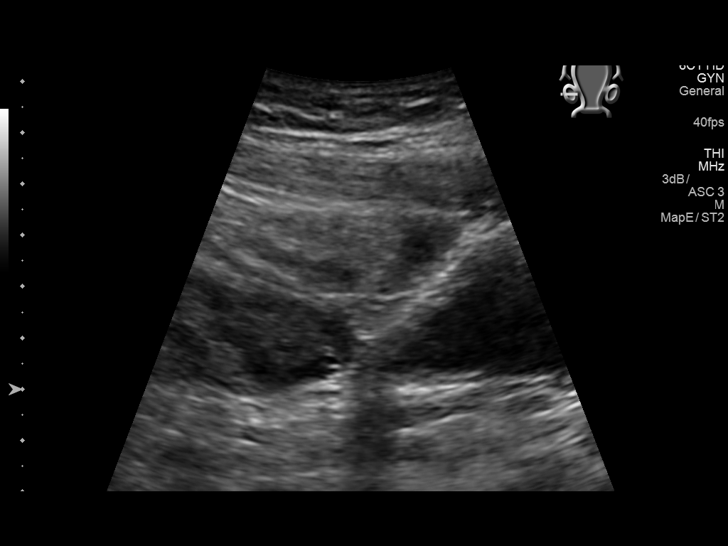
[im 50/150]
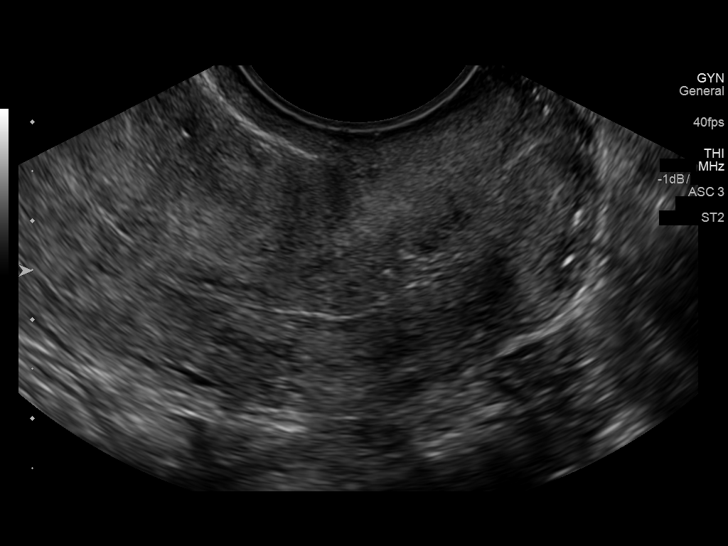
[im 63/150]
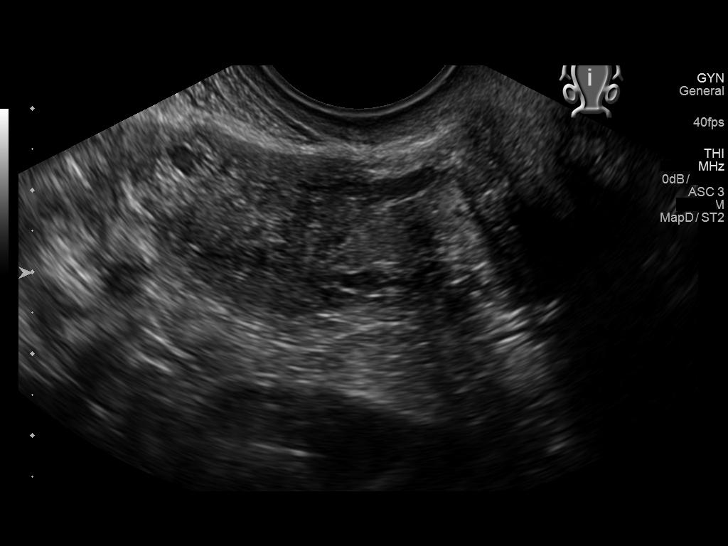
[im 75/150]
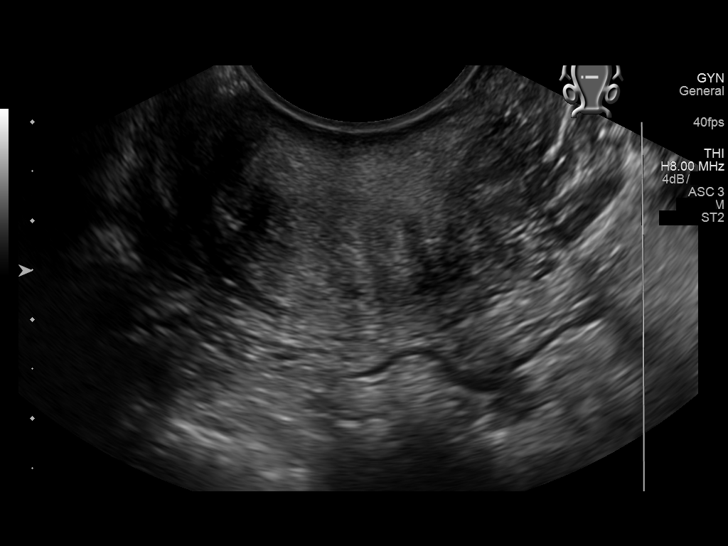
[im 87/150]
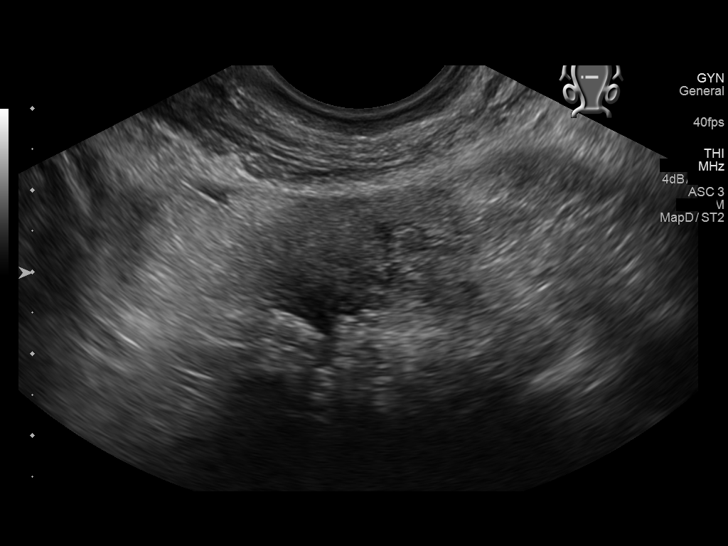
[im 100/150]
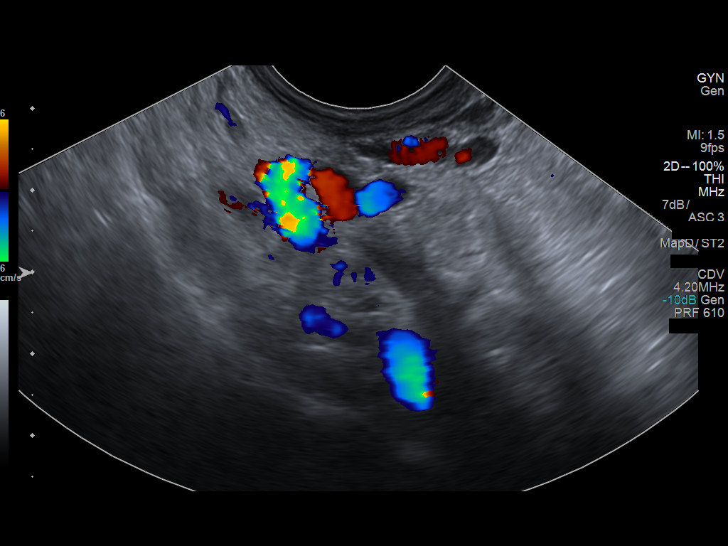
[im 112/150]
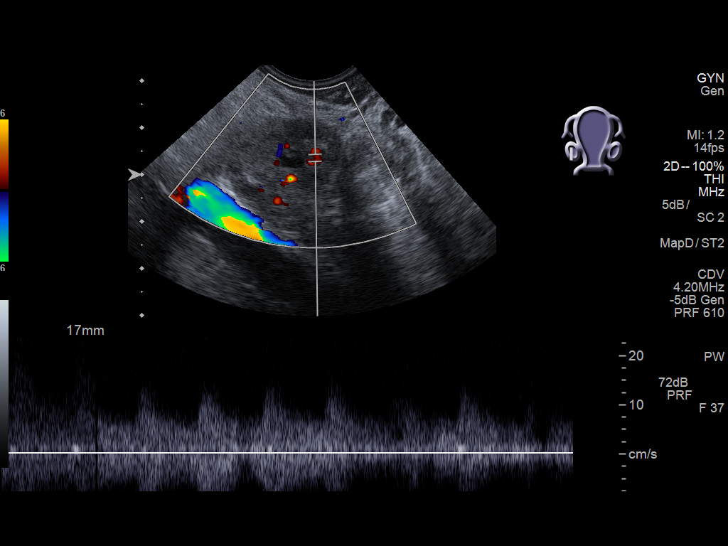
[im 125/150]
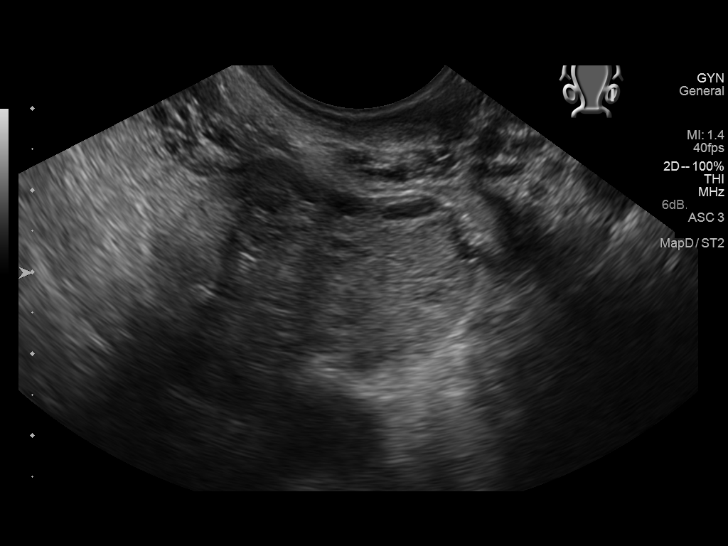
[im 137/150]
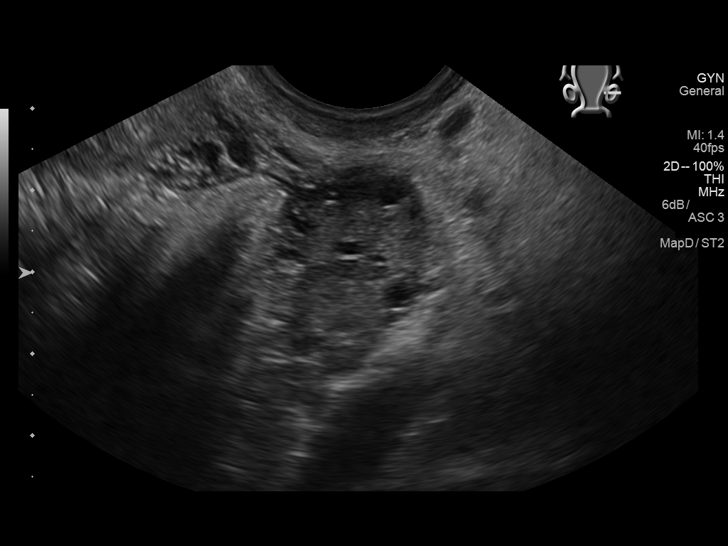
[im 150/150]
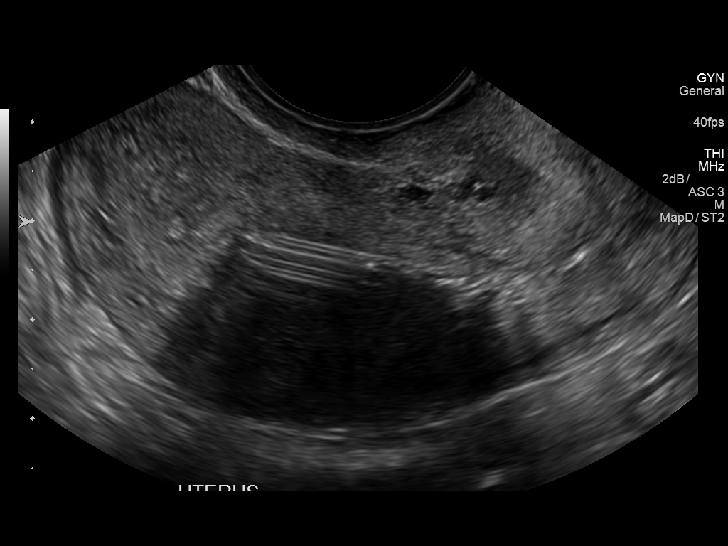

[13 of 25 positions shown; findings below may reference images not displayed]

FINDINGS: Uterus

Measurements: 7.2 by 3.2 by 5.3 cm. No fibroids or other mass
visualized.

Endometrium

Thickness: 6 mm.  IUD appears satisfactorily positioned.

Right ovary

Measurements: 2.9 by 2.2 by 2.6 cm. Normal appearance/no adnexal
mass.

Left ovary

Measurements: 3.2 by 2.6 by 2.1 cm. Suspected corpus luteum cyst
by 1.7 by 1.9 cm.

Pulsed Doppler evaluation of both ovaries demonstrates normal
low-resistance arterial and venous waveforms.

Other findings

No abnormal free fluid.
IMPRESSION: 1. No specific abnormality is identified. There is a 2 cm suspected
corpus luteum cyst in the left ovary. No free pelvic fluid. The IUD
appears satisfactorily positioned.

## 2017-09-18 ENCOUNTER — Other Ambulatory Visit: Payer: Self-pay

## 2017-09-18 ENCOUNTER — Encounter: Payer: Self-pay | Admitting: Emergency Medicine

## 2017-09-18 DIAGNOSIS — G43909 Migraine, unspecified, not intractable, without status migrainosus: Secondary | ICD-10-CM | POA: Insufficient documentation

## 2017-09-18 DIAGNOSIS — Z5321 Procedure and treatment not carried out due to patient leaving prior to being seen by health care provider: Secondary | ICD-10-CM | POA: Insufficient documentation

## 2017-09-18 MED ORDER — BUTALBITAL-APAP-CAFFEINE 50-325-40 MG PO TABS
ORAL_TABLET | ORAL | Status: AC
  Filled 2017-09-18: qty 2

## 2017-09-18 MED ORDER — ONDANSETRON 4 MG PO TBDP
4.0000 mg | ORAL_TABLET | Freq: Once | ORAL | Status: AC
  Administered 2017-09-18: 4 mg via ORAL

## 2017-09-18 MED ORDER — ONDANSETRON 4 MG PO TBDP
ORAL_TABLET | ORAL | Status: AC
  Filled 2017-09-18: qty 1

## 2017-09-18 MED ORDER — BUTALBITAL-APAP-CAFFEINE 50-325-40 MG PO TABS
2.0000 | ORAL_TABLET | Freq: Once | ORAL | Status: AC
  Administered 2017-09-18: 2 via ORAL

## 2017-09-18 NOTE — ED Triage Notes (Addendum)
Pt reports migraine since waking this am; N/V; minimal sensitivity to lights and sounds; not prescribed any medication for migraines and has not seen her doctor for same in some time; Tylenol and Ibuprofen with no relief; pt says this is stronger than her normal migraine; says she can't keep still and she can't keep anything down; pt adds she was diagnosed with viral infection 2-3 weeks ago-cough

## 2017-09-19 ENCOUNTER — Emergency Department
Admission: EM | Admit: 2017-09-19 | Discharge: 2017-09-19 | Disposition: A | Discharge status: Home / Self Care | Payer: Self-pay | Attending: Emergency Medicine | Admitting: Emergency Medicine

## 2017-09-19 HISTORY — DX: Migraine, unspecified, not intractable, without status migrainosus: G43.909
